# Patient Record
Sex: Male | Born: 1945 | Race: White | Hispanic: No | Marital: Single | Smoking: Former smoker
Health system: Southern US, Community
[De-identification: ages and names within clinical notes are randomized; demographics above are authoritative.]

---

## 1999-01-16 ENCOUNTER — Ambulatory Visit (HOSPITAL_COMMUNITY): Admission: RE | Admit: 1999-01-16 | Discharge: 1999-01-16 | Payer: Self-pay | Admitting: Family Medicine

## 1999-01-16 ENCOUNTER — Encounter: Payer: Self-pay | Admitting: Family Medicine

## 1999-09-01 ENCOUNTER — Encounter: Payer: Self-pay | Admitting: Family Medicine

## 1999-09-01 ENCOUNTER — Ambulatory Visit (HOSPITAL_COMMUNITY): Admission: RE | Admit: 1999-09-01 | Discharge: 1999-09-01 | Payer: Self-pay | Admitting: Orthopedic Surgery

## 2001-05-01 ENCOUNTER — Encounter: Payer: Self-pay | Admitting: General Surgery

## 2001-05-08 ENCOUNTER — Ambulatory Visit (HOSPITAL_COMMUNITY): Admission: RE | Admit: 2001-05-08 | Discharge: 2001-05-08 | Payer: Self-pay | Admitting: General Surgery

## 2001-05-08 ENCOUNTER — Encounter (INDEPENDENT_AMBULATORY_CARE_PROVIDER_SITE_OTHER): Payer: Self-pay | Admitting: Specialist

## 2002-07-14 ENCOUNTER — Encounter: Payer: Self-pay | Admitting: Emergency Medicine

## 2002-07-14 ENCOUNTER — Emergency Department (HOSPITAL_COMMUNITY): Admission: EM | Admit: 2002-07-14 | Discharge: 2002-07-14 | Payer: Self-pay | Admitting: Emergency Medicine

## 2004-06-27 ENCOUNTER — Ambulatory Visit (HOSPITAL_COMMUNITY): Admission: RE | Admit: 2004-06-27 | Discharge: 2004-06-27 | Payer: Self-pay | Admitting: Family Medicine

## 2006-04-01 ENCOUNTER — Ambulatory Visit: Payer: Self-pay | Admitting: Gastroenterology

## 2006-04-12 ENCOUNTER — Encounter (INDEPENDENT_AMBULATORY_CARE_PROVIDER_SITE_OTHER): Payer: Self-pay | Admitting: Specialist

## 2006-04-12 ENCOUNTER — Ambulatory Visit: Payer: Self-pay | Admitting: Gastroenterology

## 2006-11-28 ENCOUNTER — Emergency Department (HOSPITAL_COMMUNITY): Admission: EM | Admit: 2006-11-28 | Discharge: 2006-11-28 | Payer: Self-pay | Admitting: Emergency Medicine

## 2010-01-30 ENCOUNTER — Ambulatory Visit (HOSPITAL_COMMUNITY)
Admission: RE | Admit: 2010-01-30 | Discharge: 2010-01-30 | Payer: Self-pay | Source: Home / Self Care | Attending: Urology | Admitting: Urology

## 2010-04-28 ENCOUNTER — Ambulatory Visit (HOSPITAL_BASED_OUTPATIENT_CLINIC_OR_DEPARTMENT_OTHER)
Admission: RE | Admit: 2010-04-28 | Discharge: 2010-04-28 | Disposition: A | Discharge status: Home / Self Care | Payer: Medicare Other | Source: Ambulatory Visit | Attending: Urology | Admitting: Urology

## 2010-04-28 DIAGNOSIS — Z01812 Encounter for preprocedural laboratory examination: Secondary | ICD-10-CM | POA: Insufficient documentation

## 2010-04-28 DIAGNOSIS — Z01810 Encounter for preprocedural cardiovascular examination: Secondary | ICD-10-CM | POA: Insufficient documentation

## 2010-04-28 DIAGNOSIS — R9431 Abnormal electrocardiogram [ECG] [EKG]: Secondary | ICD-10-CM | POA: Insufficient documentation

## 2010-04-28 DIAGNOSIS — N201 Calculus of ureter: Secondary | ICD-10-CM | POA: Insufficient documentation

## 2010-04-28 DIAGNOSIS — N1330 Unspecified hydronephrosis: Secondary | ICD-10-CM | POA: Insufficient documentation

## 2010-04-28 LAB — POCT HEMOGLOBIN-HEMACUE: Hemoglobin: 16.7 g/dL (ref 13.0–17.0)

## 2010-04-29 NOTE — Op Note (Signed)
  NAMEWILLIAMS, Colin            ACCOUNT NO.:  [id number]  MEDICAL RECORD NO.:  [id number]          PATIENT TYPE:  AMB  LOCATION:  DAY                          FACILITY:  [facility]  PHYSICIAN:  Maretta Bees. 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  PROCEDURE:  Cystoscopy, left ureteroscopy, laser fragmentation and basketing of stone and insertion of left double-J catheter after left retrograde pyelogram with interpretation.  SURGEON:  Maretta Bees. Vonita Moss, M.D.  ANESTHESIA:  General.  INDICATIONS:  This gentleman has had a longstanding ureteral calculus and delayed therapy per his decision and finally agreed to treatment earlier this year and he underwent lithotripsy in January for this large distal left ureteral stone with chronic high-grade obstruction. Unfortunately, there was very little stone passage and the stone was basically still intact.  He was counseled about the need to remove the stone and unobstruct his kidney.  I talked about postop pain, discomfort, bleeding.  He knows about double-J catheter.  PROCEDURE:  The patient was brought to the operating room and placed in lithotomy position.  External genitalia prepped and draped in usual fashion.  He was cystoscoped and the anterior urethra was normal.  He had partial prostatic obstruction.  The bladder had mild trabeculation. I then passed a retractable core guidewire up the left ureter and passed the stone without difficulty.  I then used the inner core of the ureteral dilating access sheath to dilate the intramural ureter.  I then used the short rigid ureteroscope and encountered an edematous area just below the stone.  I was  unable to reach the stone under direct visualization and was obviously it too big to basket.  Therefore I used the holmium laser to fragment the stone into multiple small pieces.  I then used the stone basket to remove all the pieces except for some fine sand like gravel that was too small to basket.  There should be no problem passing the rest of this fine material.  I then performed left retrograde pyelogram through the open-ended ureteral catheter placed cystoscopically and he had a tortuous upper ureter and a very large pyelocaliceal system indicative of his chronic hydronephrosis.  After measuring the ureteral length, I inserted a 6-French 28 cm contour double-J catheter with a full coil in the upper calix and a full coil in the bladder and string brought out per urethra.  Stones were irrigated out from the bladder and given to the patient.  He was then taken to recovery room in good condition having tolerated the procedure well.     Maretta Bees. Vonita Moss, M.D.     LJP/MEDQ  D:  04/28/2010  T:  04/28/2010  Job:  [id number]  Electronically Signed by Larey Dresser M.D. on 04/29/2010 05:05:53 PM

## 2010-05-26 NOTE — Op Note (Signed)
[facility]  Patient:    Colin Andersen, PROM Visit Number: [id number] MRN: [id number]          Service Type: DSU Location: DAY Attending Physician:  Caleen Essex Dictated by:   Ollen Gross. Vernell Morgans, M.D. Admit Date:  05/08/2001 Discharge Date: 05/08/2001                             Operative Report  PREOPERATIVE DIAGNOSIS:  Right inguinal and umbilical hernia.  POSTOPERATIVE DIAGNOSIS:  Right inguinal and umbilical hernia.  PROCEDURE:  Right indirect inguinal hernia repair with mesh and umbilical hernia repair.  SURGEON:  Ollen Gross. Vernell Morgans, M.D.  ANESTHESIA:  General endotracheal.  DESCRIPTION OF PROCEDURE:  After informed consent was obtained, the patient was brought to the operating room and placed in the supine position on the operating room table. After adequate induction of general anesthesia, the patients abdomen and right groin were prepped with Betadine and draped in the usual sterile manner. Attention was initially directed toward the right groin and an incision was made with the #15 blade knife from the edge of the pubic tubercle towards the anterior superior iliac spine for a distance of 5 to 6 cm. This incision was carried down through the skin and subcutaneous tissue using the Bovie electrocautery until the fascia of the external oblique was encountered. One small vein in the subcutaneous tissue required clamping with hemostats, dividing, and ligating with 3-9 silk ties. The external oblique fascia was cleared of any subcutaneous debris and the fascial area was then opened along its fibers with a #15 blade knife. A pair of Metzenbaum scissors was then inserted under the fascial layer to keep anything from adhering to the underside of the fascia and the fascia was then opened along its fibers to the apex of the external ring. Blunt dissection was then carried out to surround the cord structures at the edge of the pubic tubercle between  two fingers. Once this was accomplished a 1/4-inch Penrose was placed around the cord structures for retraction purposes. The ilioinguinal nerve was left to run with the cord structures. Care was taken to avoid damaging this during the dissection. Blunt dissection was then carried out of the cord structures themselves and care was taken to avoid any damage to the vas deferens. The hernia sac was identified running the cord structures and it was carefully separated from the rest of the cord structures. The hernia sac was then opened and there was no viscera within the hernia sac and the hernia sac was then ligated at its base with a 2-0 silk suture ligature. The hernia sac was sized and the stump was allowed to retract within the abdomen. The floor of the inguinal canal was then reinforced with a piece of 3 x 6 Atrium mesh. The mesh was attached to the shelving edge at the inguinal ligament using a running 2-0 Prolene stitch and tacked in multiple places superiorly with interrupted vertical mattress 2-0 Prolene stitches to the aponeurotic muscular strength layer of the transversalis. Laterally, the tails were wrapped around the spermatic cord structures and lateral to them the tails were tacked lateral to the spermatic cord with an interrupted stitch to the shelving edge of the inguinal ligament. Once this was complete, the external oblique fascia was reapproximated with a running 2-0 Prolene stitch. The wound was irrigated with saline. The subcutaneous fascia was closed with interrupted 3-0 Vicryl stitches and the  skin was closed with running 4-0 Monocryl subcuticular stitch. The wound was infiltrated with 0.25% Marcaine for pain control, benzoin and Steri-Strips and sterile dressings were applied. This wound was then covered and attention was then turned to the umbilicus.  A small curvilinear incision was made just inferior to the umbilicus with the #15 blade knife. This incision was  carried down through the skin and subcutaneous tissue using the Bovie electrocautery as well as blunt dissection with the hemostat until hernia was identified. There was obviously some preperitoneal fat within the hernia and this fat was excised sharply with the Bovie electrocautery. The remaining fat was then easily reduced back within the abdomen. The defect of the fascia was approximately 3 mm in diameter and was closed with a single figure-of-eight 0 Prolene stitch. The subcutaneous tissue was then closed with interrupted 3-0 Vicryl stitch and the skin was closed with a running 4-0 Monocryl subcuticular stitch. Benzoin and Steri-Strips and sterile dressings were applied. The patient tolerated the procedure well. At the end of the case, all needle, sponge and instrument counts were correct. The patient was awakened and taken to the recovery room in stable condition. Dictated by:   Ollen Gross. Vernell Morgans, M.D. Attending Physician:  Caleen Essex DD:  05/09/01 TD:  05/11/01 Job: [id number]

## 2010-05-30 ENCOUNTER — Other Ambulatory Visit: Payer: Self-pay | Admitting: Urology

## 2010-05-30 DIAGNOSIS — N200 Calculus of kidney: Secondary | ICD-10-CM

## 2010-06-13 ENCOUNTER — Other Ambulatory Visit: Payer: Self-pay | Admitting: Urology

## 2010-06-13 ENCOUNTER — Other Ambulatory Visit: Payer: Self-pay | Admitting: Interventional Radiology

## 2010-06-13 ENCOUNTER — Encounter (HOSPITAL_COMMUNITY): Payer: Medicare Other

## 2010-06-13 LAB — CBC
HCT: 47.5 % (ref 39.0–52.0)
MCV: 90.5 fL (ref 78.0–100.0)
Platelets: 272 10*3/uL (ref 150–400)
RBC: 5.25 MIL/uL (ref 4.22–5.81)
RDW: 13.9 % (ref 11.5–15.5)
WBC: 7.3 10*3/uL (ref 4.0–10.5)

## 2010-06-13 LAB — SURGICAL PCR SCREEN
MRSA, PCR: NEGATIVE
Staphylococcus aureus: NEGATIVE

## 2010-06-19 ENCOUNTER — Ambulatory Visit (HOSPITAL_COMMUNITY): Payer: Medicare Other

## 2010-06-19 ENCOUNTER — Other Ambulatory Visit (HOSPITAL_COMMUNITY): Payer: Medicare Other

## 2010-06-19 ENCOUNTER — Ambulatory Visit (HOSPITAL_COMMUNITY)
Admission: RE | Admit: 2010-06-19 | Discharge: 2010-06-20 | Disposition: A | Discharge status: Home / Self Care | Payer: Medicare Other | Source: Ambulatory Visit | Attending: Urology | Admitting: Urology

## 2010-06-19 ENCOUNTER — Ambulatory Visit (HOSPITAL_COMMUNITY)
Admission: RE | Admit: 2010-06-19 | Discharge: 2010-06-19 | Disposition: A | Discharge status: Home / Self Care | Payer: Medicare Other | Source: Ambulatory Visit | Attending: Urology | Admitting: Urology

## 2010-06-19 DIAGNOSIS — M129 Arthropathy, unspecified: Secondary | ICD-10-CM | POA: Insufficient documentation

## 2010-06-19 DIAGNOSIS — Z01818 Encounter for other preprocedural examination: Secondary | ICD-10-CM | POA: Insufficient documentation

## 2010-06-19 DIAGNOSIS — N200 Calculus of kidney: Secondary | ICD-10-CM | POA: Insufficient documentation

## 2010-06-19 DIAGNOSIS — Z79899 Other long term (current) drug therapy: Secondary | ICD-10-CM | POA: Insufficient documentation

## 2010-06-19 MED ORDER — IOHEXOL 300 MG/ML  SOLN
50.0000 mL | Freq: Once | INTRAMUSCULAR | Status: AC | PRN
  Administered 2010-06-19: 20 mL

## 2010-06-29 NOTE — Op Note (Signed)
Colin Andersen, PORZIO            ACCOUNT NO.:  [id number]  MEDICAL RECORD NO.:  [id number]  LOCATION:  DAYL                         FACILITY:  [facility]  PHYSICIAN:  Reynard Christoffersen C. Vernie Ammons, M.D.  DATE OF BIRTH:  1945/04/09  DATE OF PROCEDURE:  06/19/2010 DATE OF DISCHARGE:                              OPERATIVE REPORT   PREOPERATIVE DIAGNOSIS:  Right renal calculi.  POSTOPERATIVE DIAGNOSIS:  Right renal calculi.  PROCEDURES: 1. Right percutaneous nephrostolithotomy (2.3 cm). 2. Antegrade right double-J stent placement.  SURGEON:  Ritisha Deitrick C. Vernie Ammons, M.D.  ANESTHESIA:  General.  DRAINS:  A 16-French Foley catheter in the bladder and a 6-French 26-cm double-J stent in the right ureter.  SPECIMEN:  Stone given to the patient.  BLOOD LOSS:  Approximately 50 cc.  COMPLICATIONS:  None.  INDICATIONS:  The patient is a 65 year old male with a large, oval stone occupying the renal pelvis of the right kidney as well as a smaller stone in the lower pole collecting system on that same side.  Due to the large size of the stone, we discussed managing this with percutaneous nephrostolithotomy, although we did discuss the other treatment options. The risks and complications were discussed and the patient understands and has elected to proceed.  DESCRIPTION OF OPERATION:  After informed consent, the patient was brought to the major OR, placed on the table and administered general anesthesia in the supine position and then moved to the prone position. His right flank as well as the previously placed nephrostomy catheter were sterilely prepped into the field and draped after which an official time-out was then performed.  A 0.038-inch floppy tip guidewire was then passed through the nephrostomy catheter and into the bladder under direct fluoroscopy.  The nephrostomy catheter was removed, a transverse skin incision was then made and the coaxial catheter system was then passed over the  guidewire with the inner portion having been removed leaving the guidewire in place.  This allowed passage of a second guidewire to be used as a working guidewire under direct fluoroscopy into the bladder.  I then removed the coaxial catheter leaving 2 guidewires in place and secured one of the guidewires.  The second guidewire was used to pass the UroMax nephrostomy tract dilating balloon over the guidewire and into the area of the lower pole calix where it was inflated.  I then advanced the 28- French  nephrostomy sheath over the balloon and into the area of the lower pole calix.  The balloon was deflated and removed.  A 26-French rigid nephroscope was then passed under direct vision down the nephrostomy access sheath into the area of the lower pole calix.  I initially identified the stone and grasped this with 2-prong graspers and was able to extract it completely.  The larger stone in the renal pelvis was then identified and fragmented with the Swiss lithoclast. Once it was fragmented, I then used a combination of the 2-prong graspers and 3-prong graspers to extract all of the large pieces.  There were a few small pieces remaining and I then reinserted the Swiss lithoclast and used the ultrasound to fragment the stones and evacuate them simultaneously.  Inspection of the renal pelvis and UPJ  region revealed no further stones present.  I was able to see into the upper portion of the renal pelvic region and no stones were located in this area.  I then advanced the scope back into the lower pole calix and found 2 more stones in the lower pole calix which were extracted with using the 2-prong graspers.  I then back loaded the nephroscope over the working guidewire and passed the double-J stent over the guidewire into the area of the bladder and as I removed the guidewire good curl was noted in the bladder.  There was also then good curl noted in the renal pelvic region as the guidewire  was removed completely.  I then withdrew the sheath and scope back to the level of the renal parenchyma and measured this.  After measuring this, I used 10 cc of FloSeal and the laparoscopic applicator to pass this through the nephrostomy sheath to the previously measured location and then began injecting as I withdrew both simultaneously in order to fill the nephrostomy tract with FloSeal.  There was minimal bleeding throughout the procedure and therefore this tubeless method was felt appropriate.  There was no bleeding at this level of the skin and so I closed the incision with running subcuticular 4-0 Monocryl.  A sterile gauze dressing as well as an occlusive Tegaderm were applied to the incision and the patient was awakened and taken to recovery room in stable and satisfactory condition.  He tolerated the procedure well. There were no intraoperative complications.  My plan is to have him observed overnight but he asked if he could go home later today, I told him I would make a determination at that time.     Emina Ribaudo C. Vernie Ammons, M.D.     MCO/MEDQ  D:  06/19/2010  T:  06/19/2010  Job:  [id number]  Electronically Signed by Ihor Gully M.D. on 06/29/2010 05:23:26 PM

## 2010-10-17 LAB — BASIC METABOLIC PANEL
BUN: 19
CO2: 26
Glucose, Bld: 123 — ABNORMAL HIGH
Potassium: 3.6
Sodium: 139

## 2010-10-17 LAB — DIFFERENTIAL
Basophils Absolute: 0.1
Basophils Relative: 1
Eosinophils Absolute: 0.3
Eosinophils Relative: 2
Lymphocytes Relative: 19
Monocytes Absolute: 1.1 — ABNORMAL HIGH

## 2010-10-17 LAB — URINALYSIS, ROUTINE W REFLEX MICROSCOPIC
Glucose, UA: NEGATIVE
Ketones, ur: NEGATIVE
Nitrite: NEGATIVE
Protein, ur: NEGATIVE
pH: 6

## 2010-10-17 LAB — CBC
HCT: 46.3
Hemoglobin: 15.9
MCHC: 34.2
MCV: 92.3
Platelets: 296
RDW: 14

## 2010-10-17 LAB — POCT URINE HEMOGLOBIN: Operator id: 29727

## 2010-10-17 LAB — URINE MICROSCOPIC-ADD ON

## 2012-10-30 ENCOUNTER — Ambulatory Visit (INDEPENDENT_AMBULATORY_CARE_PROVIDER_SITE_OTHER): Payer: Medicare Other | Admitting: Physician Assistant

## 2012-10-30 ENCOUNTER — Other Ambulatory Visit: Payer: Self-pay | Admitting: *Deleted

## 2012-10-30 DIAGNOSIS — R079 Chest pain, unspecified: Secondary | ICD-10-CM

## 2012-10-30 NOTE — Progress Notes (Signed)
Exercise Treadmill Test Colin Andersen is a 67 y.o. male ex-smoker (quit 2013) with hx of HTN referred by PCP for atypical CP.  FHx:  Father with CABG at age 23.  No exertional CP, DOE, syncope.  Exam unremarkable.  ECG:  NSR, septal Q waves, no ST changes.  Pre-Exercise Testing Evaluation Rhythm: sinus bradycardia  Rate: 50     Test  Exercise Tolerance Test Ordering MD: Angelina Sheriff, MD  Interpreting MD: Tereso Newcomer, PA-C  Unique Test No: 1  Treadmill:  1  Indication for ETT: chest pain - rule out ischemia  Contraindication to ETT: No   Stress Modality: exercise - treadmill  Cardiac Imaging Performed: non   Protocol: standard Bruce - maximal  Max BP:  204/112  Max MPHR (bpm):  153 85% MPR (bpm):  130  MPHR obtained (bpm):  157 % MPHR obtained:  103  Reached 85% MPHR (min:sec):  1:46 Total Exercise Time (min-sec):  4:00  Workload in METS:  5.8 Borg Scale: 17  Reason ETT Terminated:  dyspnea    ST Segment Analysis At Rest: normal ST segments - no evidence of significant ST depression With Exercise: no evidence of significant ST depression  Other Information Arrhythmia:  No Angina during ETT:  absent (0) Quality of ETT:  diagnostic  ETT Interpretation:  normal - no evidence of ischemia by ST analysis  Comments: Fair exercise capacity. No chest pain.  Patient did have significant dyspnea. O2 sats 94%. Hypertensive BP response to exercise (Resting BP 155/95 => Peak Exercise BP 204/112). No significant ST-T changes to suggest ischemia.  Recommendations: F/u with PCP as directed. Consider Echocardiogram. Consider evaluation for COPD. Signed,  Tereso Newcomer, PA-C   10/30/2012 12:27 PM

## 2012-11-01 ENCOUNTER — Encounter: Payer: Self-pay | Admitting: Cardiology

## 2013-06-26 ENCOUNTER — Other Ambulatory Visit: Payer: Self-pay | Admitting: Family Medicine

## 2013-06-26 DIAGNOSIS — M545 Low back pain, unspecified: Secondary | ICD-10-CM

## 2013-06-26 DIAGNOSIS — M79652 Pain in left thigh: Secondary | ICD-10-CM

## 2013-06-30 ENCOUNTER — Ambulatory Visit
Admission: RE | Admit: 2013-06-30 | Discharge: 2013-06-30 | Disposition: A | Discharge status: Home / Self Care | Payer: Medicare Other | Source: Ambulatory Visit | Attending: Family Medicine | Admitting: Family Medicine

## 2013-06-30 DIAGNOSIS — M79652 Pain in left thigh: Secondary | ICD-10-CM

## 2013-06-30 DIAGNOSIS — M545 Low back pain, unspecified: Secondary | ICD-10-CM

## 2013-07-17 ENCOUNTER — Other Ambulatory Visit: Payer: Self-pay | Admitting: Neurosurgery

## 2013-07-17 DIAGNOSIS — M419 Scoliosis, unspecified: Secondary | ICD-10-CM

## 2013-07-21 ENCOUNTER — Ambulatory Visit
Admission: RE | Admit: 2013-07-21 | Discharge: 2013-07-21 | Disposition: A | Discharge status: Home / Self Care | Payer: Medicare Other | Source: Ambulatory Visit | Attending: Neurosurgery | Admitting: Neurosurgery

## 2013-07-21 DIAGNOSIS — M419 Scoliosis, unspecified: Secondary | ICD-10-CM

## 2013-07-21 MED ORDER — IOHEXOL 180 MG/ML  SOLN
1.0000 mL | Freq: Once | INTRAMUSCULAR | Status: AC | PRN
  Administered 2013-07-21: 1 mL via EPIDURAL

## 2013-07-21 MED ORDER — METHYLPREDNISOLONE ACETATE 40 MG/ML INJ SUSP (RADIOLOG
120.0000 mg | Freq: Once | INTRAMUSCULAR | Status: AC
  Administered 2013-07-21: 120 mg via EPIDURAL

## 2013-07-21 NOTE — Discharge Instructions (Signed)

## 2013-10-29 ENCOUNTER — Other Ambulatory Visit: Payer: Self-pay | Admitting: Neurosurgery

## 2013-10-29 DIAGNOSIS — M4316 Spondylolisthesis, lumbar region: Secondary | ICD-10-CM

## 2013-11-02 ENCOUNTER — Ambulatory Visit
Admission: RE | Admit: 2013-11-02 | Discharge: 2013-11-02 | Disposition: A | Discharge status: Home / Self Care | Payer: Medicare Other | Source: Ambulatory Visit | Attending: Neurosurgery | Admitting: Neurosurgery

## 2013-11-02 VITALS — BP 190/100 | HR 56

## 2013-11-02 DIAGNOSIS — M4316 Spondylolisthesis, lumbar region: Secondary | ICD-10-CM

## 2013-11-02 MED ORDER — IOHEXOL 180 MG/ML  SOLN
1.0000 mL | Freq: Once | INTRAMUSCULAR | Status: AC | PRN
  Administered 2013-11-02: 1 mL via EPIDURAL

## 2013-11-02 MED ORDER — METHYLPREDNISOLONE ACETATE 40 MG/ML INJ SUSP (RADIOLOG
120.0000 mg | Freq: Once | INTRAMUSCULAR | Status: AC
  Administered 2013-11-02: 120 mg via EPIDURAL

## 2014-07-30 ENCOUNTER — Other Ambulatory Visit: Payer: Self-pay | Admitting: Family Medicine

## 2014-07-30 DIAGNOSIS — I714 Abdominal aortic aneurysm, without rupture, unspecified: Secondary | ICD-10-CM

## 2014-08-02 ENCOUNTER — Ambulatory Visit
Admission: RE | Admit: 2014-08-02 | Discharge: 2014-08-02 | Disposition: A | Discharge status: Home / Self Care | Payer: Medicare Other | Source: Ambulatory Visit | Attending: Family Medicine | Admitting: Family Medicine

## 2014-08-02 DIAGNOSIS — I714 Abdominal aortic aneurysm, without rupture, unspecified: Secondary | ICD-10-CM

## 2015-05-09 DEATH — deceased

## 2016-02-16 ENCOUNTER — Encounter: Payer: Self-pay | Admitting: Gastroenterology

## 2017-01-18 IMAGING — US US AORTA
1 series · 14 of 16 positions shown · non-contrast
Comparison: None.

CLINICAL DATA: Screening for abdominal aortic aneurysm.

EXAM:
ULTRASOUND OF ABDOMINAL AORTA
TECHNIQUE: Ultrasound examination of the abdominal aorta was performed to
evaluate for abdominal aortic aneurysm.

[Series 1: us aorta · 0.26mm/px · 14 of 16 slices shown]
[im 1/16]
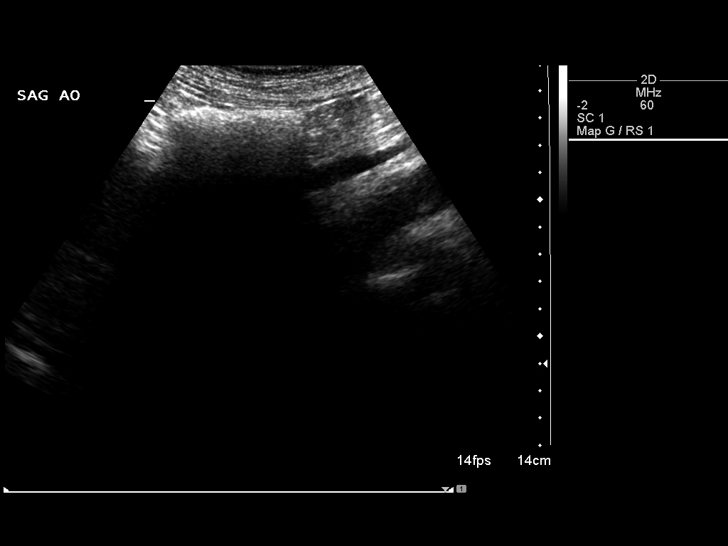
[im 2/16]
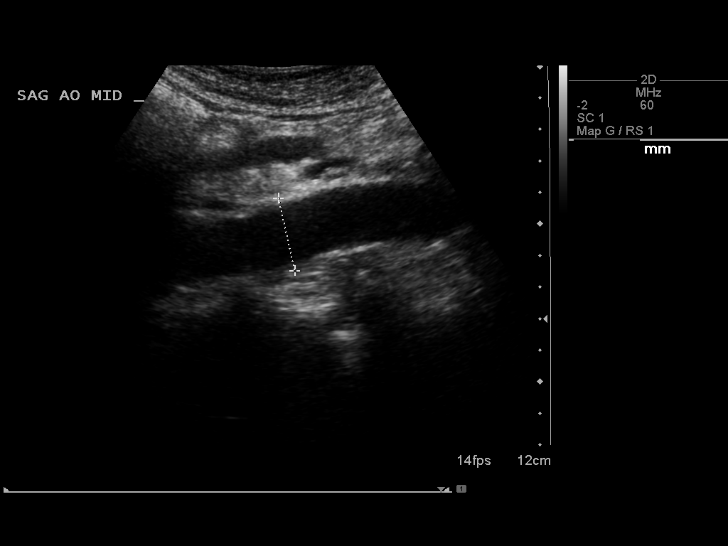
[im 3/16]
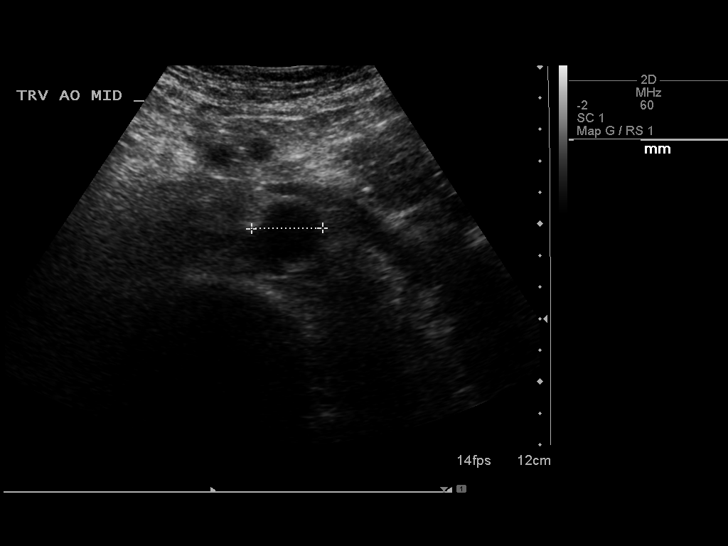
[im 5/16]
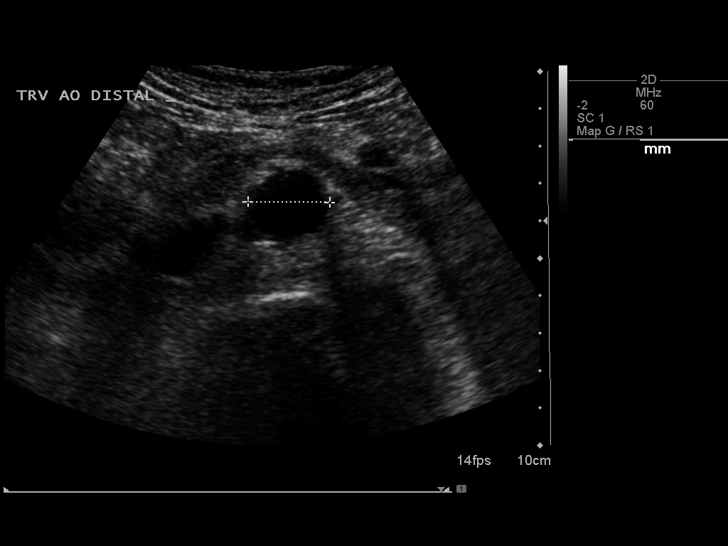
[im 6/16]
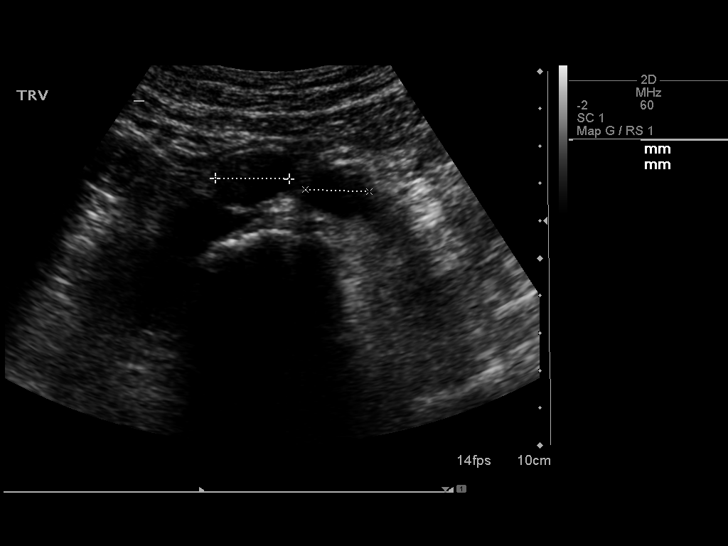
[im 7/16]
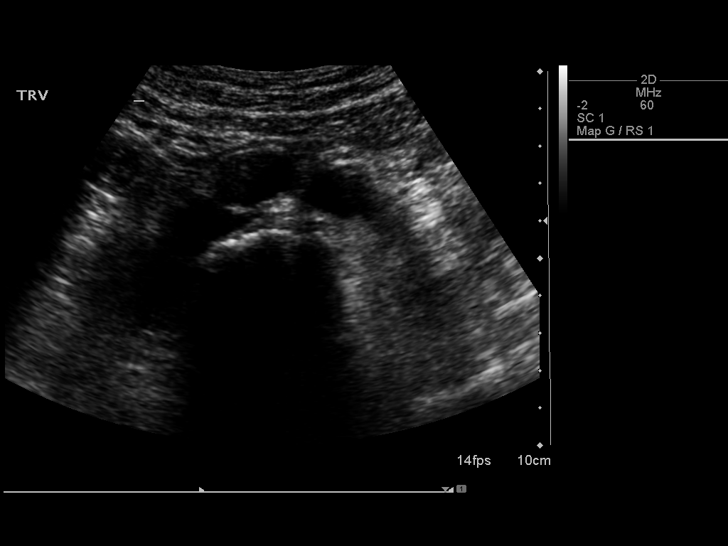
[im 8/16]
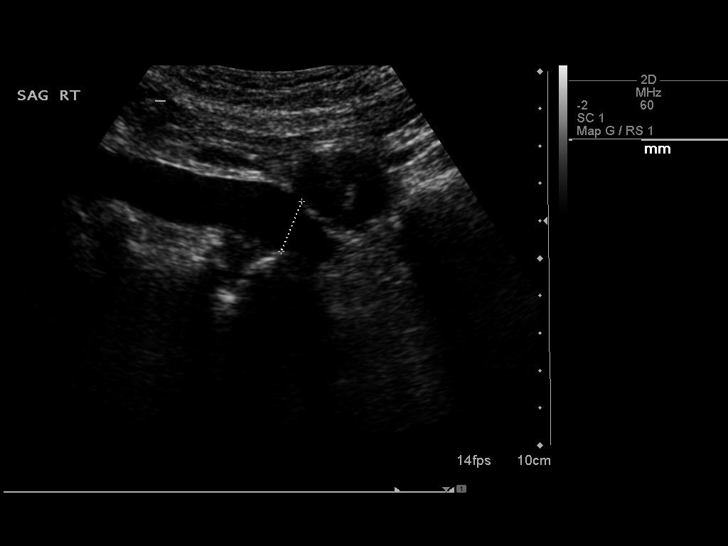
[im 9/16]
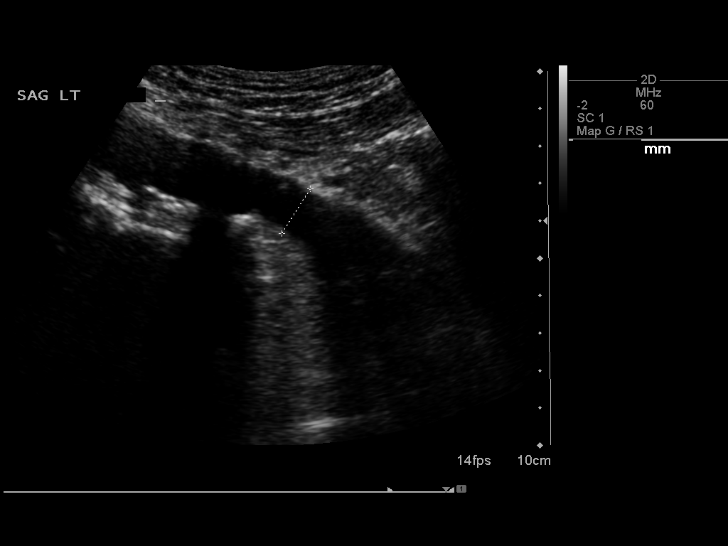
[im 10/16]
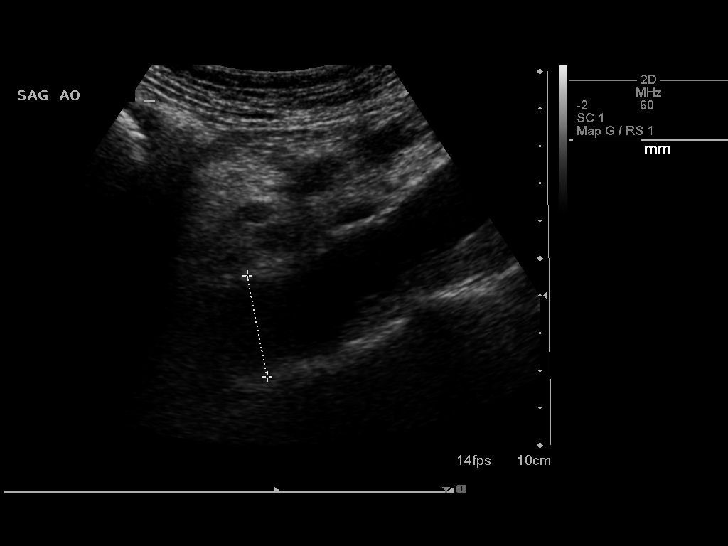
[im 11/16]
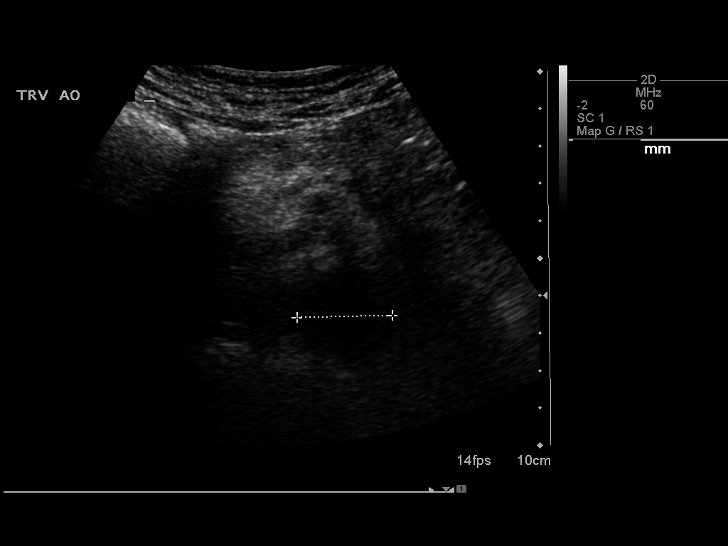
[im 13/16]
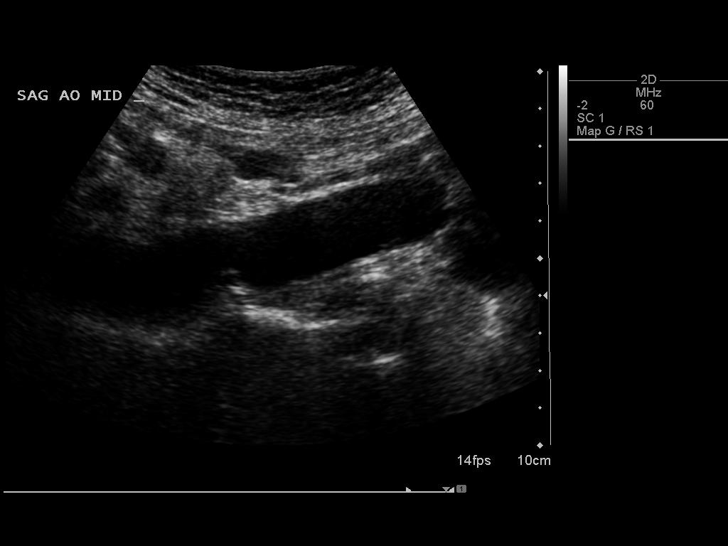
[im 14/16]
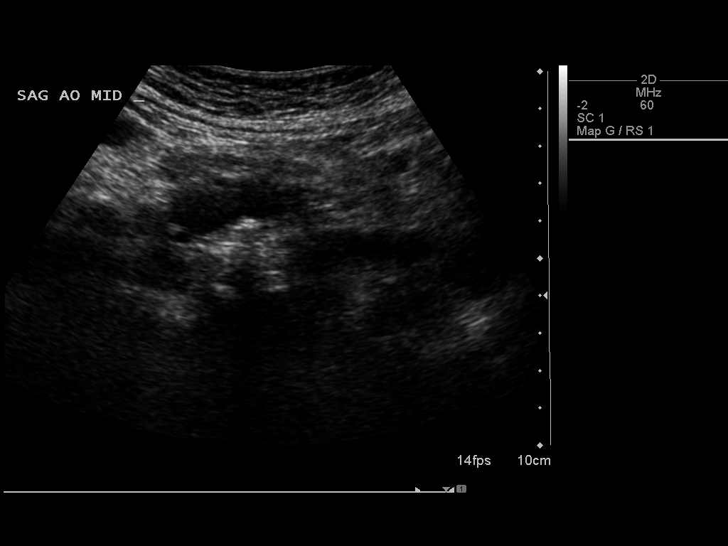
[im 15/16]
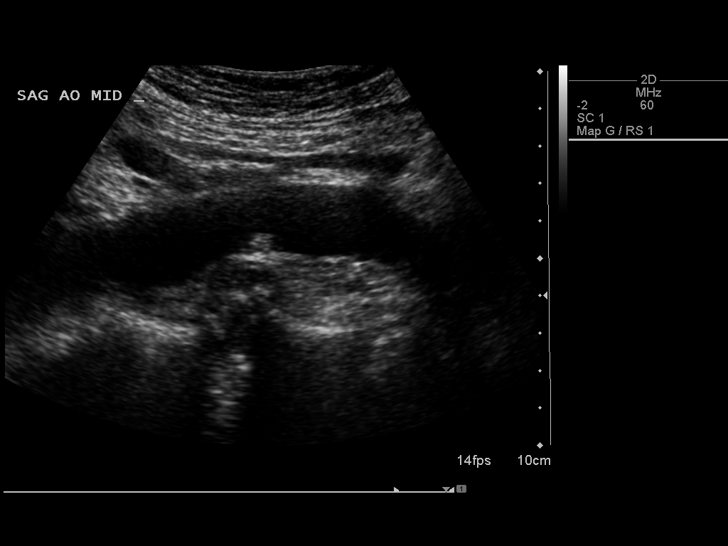
[im 16/16]
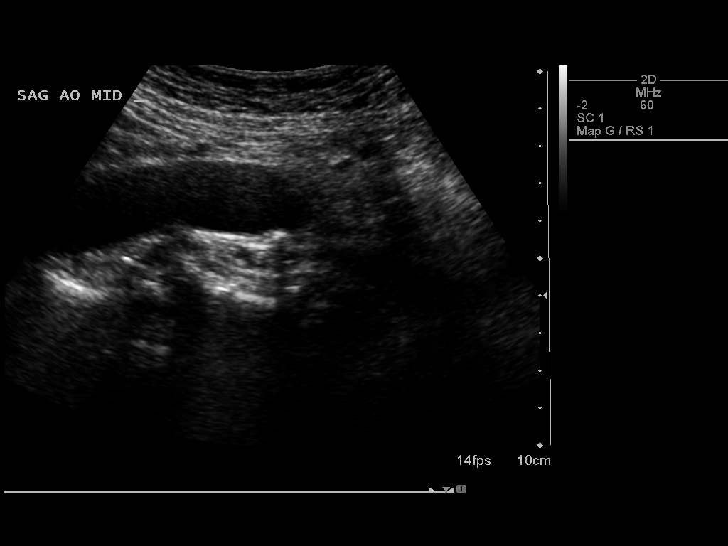

[14 of 16 positions shown; findings below may reference images not displayed]

FINDINGS: Abdominal Aorta

Mild atherosclerotic plaque formation is noted in the middle and
distal portion of the abdominal aorta.

Maximum AP

Diameter:  2.8 cm proximally.

Maximum TRV

Diameter: 2.6 cm proximally.
IMPRESSION: No evidence of abdominal aortic aneurysm.
